# Patient Record
Sex: Male | Born: 2000
Health system: Southern US, Community
[De-identification: ages and names within clinical notes are randomized; demographics above are authoritative.]

## PROBLEM LIST (undated history)

## (undated) DIAGNOSIS — F909 Attention-deficit hyperactivity disorder, unspecified type: Secondary | ICD-10-CM

## (undated) HISTORY — PX: OTHER SURGICAL HISTORY: SHX169

## (undated) HISTORY — PX: TONSILLECTOMY: SUR1361

## (undated) HISTORY — PX: TYMPANOSTOMY TUBE PLACEMENT: SHX32

---
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
## 2019-04-07 DIAGNOSIS — J309 Allergic rhinitis, unspecified: Secondary | ICD-10-CM | POA: Diagnosis not present

## 2019-04-07 DIAGNOSIS — R3915 Urgency of urination: Secondary | ICD-10-CM | POA: Diagnosis not present

## 2019-04-07 DIAGNOSIS — K5900 Constipation, unspecified: Secondary | ICD-10-CM | POA: Diagnosis not present

## 2019-04-07 DIAGNOSIS — J039 Acute tonsillitis, unspecified: Secondary | ICD-10-CM | POA: Diagnosis not present

## 2019-04-07 DIAGNOSIS — F1220 Cannabis dependence, uncomplicated: Secondary | ICD-10-CM | POA: Diagnosis not present

## 2019-04-07 DIAGNOSIS — Z7251 High risk heterosexual behavior: Secondary | ICD-10-CM | POA: Diagnosis not present

## 2019-04-07 DIAGNOSIS — R0602 Shortness of breath: Secondary | ICD-10-CM | POA: Diagnosis not present

## 2019-04-08 DIAGNOSIS — A749 Chlamydial infection, unspecified: Secondary | ICD-10-CM | POA: Diagnosis not present

## 2019-04-15 DIAGNOSIS — F902 Attention-deficit hyperactivity disorder, combined type: Secondary | ICD-10-CM | POA: Diagnosis not present

## 2019-04-15 DIAGNOSIS — Z713 Dietary counseling and surveillance: Secondary | ICD-10-CM | POA: Diagnosis not present

## 2019-04-15 DIAGNOSIS — F4321 Adjustment disorder with depressed mood: Secondary | ICD-10-CM | POA: Diagnosis not present

## 2019-04-15 DIAGNOSIS — Z0000 Encounter for general adult medical examination without abnormal findings: Secondary | ICD-10-CM | POA: Diagnosis not present

## 2019-04-15 DIAGNOSIS — R799 Abnormal finding of blood chemistry, unspecified: Secondary | ICD-10-CM | POA: Diagnosis not present

## 2019-04-15 DIAGNOSIS — F1220 Cannabis dependence, uncomplicated: Secondary | ICD-10-CM | POA: Diagnosis not present

## 2019-04-15 DIAGNOSIS — Z0001 Encounter for general adult medical examination with abnormal findings: Secondary | ICD-10-CM | POA: Diagnosis not present

## 2019-04-15 DIAGNOSIS — Z1331 Encounter for screening for depression: Secondary | ICD-10-CM | POA: Diagnosis not present

## 2019-04-15 DIAGNOSIS — R899 Unspecified abnormal finding in specimens from other organs, systems and tissues: Secondary | ICD-10-CM | POA: Diagnosis not present

## 2019-04-15 DIAGNOSIS — F17200 Nicotine dependence, unspecified, uncomplicated: Secondary | ICD-10-CM | POA: Diagnosis not present

## 2019-04-21 ENCOUNTER — Telehealth: Payer: Self-pay | Admitting: Physician Assistant

## 2019-04-21 ENCOUNTER — Encounter: Payer: Self-pay | Admitting: Physician Assistant

## 2019-04-21 ENCOUNTER — Other Ambulatory Visit: Payer: Self-pay

## 2019-04-21 ENCOUNTER — Ambulatory Visit (INDEPENDENT_AMBULATORY_CARE_PROVIDER_SITE_OTHER): Payer: No Typology Code available for payment source | Admitting: Physician Assistant

## 2019-04-21 DIAGNOSIS — L905 Scar conditions and fibrosis of skin: Secondary | ICD-10-CM

## 2019-04-21 DIAGNOSIS — L700 Acne vulgaris: Secondary | ICD-10-CM

## 2019-04-21 DIAGNOSIS — L729 Follicular cyst of the skin and subcutaneous tissue, unspecified: Secondary | ICD-10-CM

## 2019-04-21 MED ORDER — ADAPALENE-BENZOYL PEROXIDE 0.1-2.5 % EX GEL: 1.0000 "application " | Freq: Every day | CUTANEOUS | 0 refills | Status: AC

## 2019-04-21 MED FILL — ADAPALENE-BENZOYL PEROXIDE: 0.1-2.5 | 30 days supply | Qty: 45 | Fill #0

## 2019-04-21 NOTE — Telephone Encounter (Signed)
Left voicemail that they can call and have the prescriptions transferred to the [facility].

## 2019-04-21 NOTE — Progress Notes (Addendum)
   New Patient Visit  Subjective  Jake Thomas is a 19 y.o. male who presents for the following: Cyst (L shoulder, under mid chin and L cheek). Also has regular acne on face, back and has been a problem for about 2 years. The cyst on the shoulder has been there for more than 2 years. Has been a problem with being visible and having an odor. The left cheek feels like there is scar tissue under the skin and has breakouts that come and go there. He also has had a cyst there that got big and red and opened and drained on its own in the past.    Objective  Well appearing patient in no apparent distress; mood and affect are within normal limits.  Face, chest and back were examined. No suspicious moles noted on back.  Pertinent skin findings in the Assessment and Plan.   Assessment & Plan  Acne vulgaris (2) Right Zygomatic Area; Left Buccal Cheek   Ordered Medications: Adapalene-Benzoyl Peroxide (EPIDUO) 0.1-2.5 % gel  Cyst of skin Left Shoulder - Anterior  Wil schedule for excision with Dr. Jorja Loa.  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 The documentation on 06/04/19 for the exam, diagnosis, procedures, and orders are all accurate and complete.

## 2019-04-21 NOTE — Telephone Encounter (Signed)
Patient now uses new pharmacy [facility] instead of [facility].   **Patient was Seen today by JCB.

## 2019-04-28 ENCOUNTER — Ambulatory Visit: Payer: No Typology Code available for payment source | Admitting: Physician Assistant

## 2019-04-29 ENCOUNTER — Ambulatory Visit (INDEPENDENT_AMBULATORY_CARE_PROVIDER_SITE_OTHER): Payer: 59 | Admitting: Dermatology

## 2019-04-29 ENCOUNTER — Other Ambulatory Visit: Payer: Self-pay

## 2019-04-29 ENCOUNTER — Encounter: Payer: Self-pay | Admitting: Dermatology

## 2019-04-29 DIAGNOSIS — L729 Follicular cyst of the skin and subcutaneous tissue, unspecified: Secondary | ICD-10-CM

## 2019-04-29 DIAGNOSIS — L720 Epidermal cyst: Secondary | ICD-10-CM | POA: Diagnosis not present

## 2019-04-29 NOTE — Patient Instructions (Signed)

## 2019-05-01 NOTE — Progress Notes (Addendum)
   New Patient   Subjective  Jake Thomas is a 19 y.o. male who presents for the following: Procedure (Patient here today for cyst removal on left shoulder x 2 years.  Patient denies pain, per patient at times it does have odor).   Cyst Location: Left inner shoulder Duration: Several years Quality: Stable Associated Signs/Symptoms: Modifying Factors:  Severity:  Timing: Context: Patient requests removal   The following portions of the chart were reviewed this encounter and updated as appropriate:     Objective  Well appearing patient in no apparent distress; mood and affect are within normal limits.  A focused examination was performed including head, neck, left chest/back. Relevant physical exam findings are noted in the Assessment and Plan.   Assessment & Plan  Cyst of skin Left Collarbone  Hibiclens, infiltrative lidocaine-epi, 1cm ellipse, fibrotic 1.5cm lesion dissected out in toto, layered vicryl/ethilon close, pressure dressing. Return for outer sutures two weeks.  Skin excision - Left Collarbone  Lesion length (cm):  1.9 Lesion width (cm):  1.9 Margin per side (cm):  0 Total excision diameter (cm):  1.9 Informed consent: discussed and consent obtained   Timeout: patient name, date of birth, surgical site, and procedure verified   Procedure prep:  Patient was prepped and draped in usual sterile fashion Prep type:  Povidone-iodine Anesthesia: the lesion was anesthetized in a standard fashion   Anesthetic:  1% lidocaine w/ epinephrine 1-100,000 buffered w/ 8.4% NaHCO3 Instrument used: #15 blade   Hemostasis achieved with: pressure   Outcome: patient tolerated procedure well with no complications   Post-procedure details: sterile dressing applied and wound care instructions given   Dressing type: petrolatum and pressure dressing    Skin repair - Left Collarbone Complexity:  Intermediate Undermining: edges could be approximated without difficulty and  edges undermined   Subcutaneous layers (deep stitches):  Suture size:  4-0 Suture type: Vicryl (polyglactin 910)   Stitches:  Buried vertical mattress Fine/surface layer approximation (top stitches):  Suture size:  3-0 Stitches: simple interrupted   Suture removal (days):  7 Hemostasis achieved with: suture Outcome: patient tolerated procedure well with no complications   Post-procedure details: sterile dressing applied and wound care instructions given   Dressing type: petrolatum and pressure dressing (mupirocin)    Specimen 1 - Surgical pathology Differential Diagnosis: R/O Cyst Check Margins: No Inflamed cyst left inner shoulder, patient requests removal.

## 2019-05-10 ENCOUNTER — Ambulatory Visit (INDEPENDENT_AMBULATORY_CARE_PROVIDER_SITE_OTHER): Payer: 59

## 2019-05-10 ENCOUNTER — Other Ambulatory Visit: Payer: Self-pay

## 2019-05-10 DIAGNOSIS — L729 Follicular cyst of the skin and subcutaneous tissue, unspecified: Secondary | ICD-10-CM

## 2019-05-10 DIAGNOSIS — Z4802 Encounter for removal of sutures: Secondary | ICD-10-CM

## 2019-05-10 NOTE — Progress Notes (Signed)
NTS suture removal. No s/s of infection and path to pt.  No sutures removed because patient stated they fell out about 2 days ago.

## 2019-05-11 DIAGNOSIS — Z0001 Encounter for general adult medical examination with abnormal findings: Secondary | ICD-10-CM | POA: Diagnosis not present

## 2019-05-11 DIAGNOSIS — Z749 Problem related to care provider dependency, unspecified: Secondary | ICD-10-CM | POA: Diagnosis not present

## 2019-05-11 DIAGNOSIS — F4320 Adjustment disorder, unspecified: Secondary | ICD-10-CM | POA: Diagnosis not present

## 2019-05-11 DIAGNOSIS — R062 Wheezing: Secondary | ICD-10-CM | POA: Diagnosis not present

## 2019-05-11 DIAGNOSIS — J309 Allergic rhinitis, unspecified: Secondary | ICD-10-CM | POA: Diagnosis not present

## 2019-05-11 DIAGNOSIS — Z113 Encounter for screening for infections with a predominantly sexual mode of transmission: Secondary | ICD-10-CM | POA: Diagnosis not present

## 2019-05-11 DIAGNOSIS — Z118 Encounter for screening for other infectious and parasitic diseases: Secondary | ICD-10-CM | POA: Diagnosis not present

## 2019-05-11 DIAGNOSIS — A749 Chlamydial infection, unspecified: Secondary | ICD-10-CM | POA: Diagnosis not present

## 2019-05-20 ENCOUNTER — Encounter: Payer: No Typology Code available for payment source | Admitting: Dermatology

## 2019-05-21 DIAGNOSIS — J309 Allergic rhinitis, unspecified: Secondary | ICD-10-CM | POA: Diagnosis not present

## 2019-05-21 DIAGNOSIS — Z749 Problem related to care provider dependency, unspecified: Secondary | ICD-10-CM | POA: Diagnosis not present

## 2019-05-21 DIAGNOSIS — R062 Wheezing: Secondary | ICD-10-CM | POA: Diagnosis not present

## 2019-05-21 DIAGNOSIS — F4320 Adjustment disorder, unspecified: Secondary | ICD-10-CM | POA: Diagnosis not present

## 2019-05-21 DIAGNOSIS — A749 Chlamydial infection, unspecified: Secondary | ICD-10-CM | POA: Diagnosis not present

## 2019-05-21 DIAGNOSIS — Z0001 Encounter for general adult medical examination with abnormal findings: Secondary | ICD-10-CM | POA: Diagnosis not present

## 2019-05-21 MED FILL — DOXYCYCLINE HYCLATE 100 MG: 100 | 7 days supply | Qty: 14 | Fill #0

## 2019-06-04 ENCOUNTER — Ambulatory Visit (INDEPENDENT_AMBULATORY_CARE_PROVIDER_SITE_OTHER): Payer: 59 | Admitting: Physician Assistant

## 2019-06-04 ENCOUNTER — Encounter: Payer: Self-pay | Admitting: Physician Assistant

## 2019-06-04 ENCOUNTER — Encounter (INDEPENDENT_AMBULATORY_CARE_PROVIDER_SITE_OTHER): Payer: Self-pay

## 2019-06-04 ENCOUNTER — Other Ambulatory Visit: Payer: Self-pay

## 2019-06-04 DIAGNOSIS — J309 Allergic rhinitis, unspecified: Secondary | ICD-10-CM | POA: Diagnosis not present

## 2019-06-04 DIAGNOSIS — L700 Acne vulgaris: Secondary | ICD-10-CM

## 2019-06-04 DIAGNOSIS — A749 Chlamydial infection, unspecified: Secondary | ICD-10-CM | POA: Diagnosis not present

## 2019-06-04 DIAGNOSIS — R062 Wheezing: Secondary | ICD-10-CM | POA: Diagnosis not present

## 2019-06-04 DIAGNOSIS — Z113 Encounter for screening for infections with a predominantly sexual mode of transmission: Secondary | ICD-10-CM | POA: Diagnosis not present

## 2019-06-04 DIAGNOSIS — Z0001 Encounter for general adult medical examination with abnormal findings: Secondary | ICD-10-CM | POA: Diagnosis not present

## 2019-06-04 DIAGNOSIS — Z118 Encounter for screening for other infectious and parasitic diseases: Secondary | ICD-10-CM | POA: Diagnosis not present

## 2019-06-04 DIAGNOSIS — F4320 Adjustment disorder, unspecified: Secondary | ICD-10-CM | POA: Diagnosis not present

## 2019-06-04 DIAGNOSIS — Z749 Problem related to care provider dependency, unspecified: Secondary | ICD-10-CM | POA: Diagnosis not present

## 2019-06-04 MED ORDER — CLINDAMYCIN PHOSPHATE 1 % EX GEL: Freq: Every morning | CUTANEOUS | 2 refills | Status: AC

## 2019-06-04 MED FILL — CLINDAMYCIN PHOSPHATE 1 % G: 1 | 20 days supply | Qty: 60 | Fill #0

## 2019-06-04 NOTE — Progress Notes (Signed)
   Follow up Visit  Subjective  Jake Thomas is a 19 y.o. male who presents for the following: Acne (Wants to discuss treatments for his acne for face and neck). He recently came in for excision of a cyst near the ear. He feels he has more cysts near ear lobes and on neck. He has been using the Epiduo and it seems to helping but he is still getting many new bumps. It doesn't irritate his skin.   Objective  Well appearing patient in no apparent distress; mood and affect are within normal limits.  Face, chest, back examined. Relevant physical exam findings are noted in the Assessment and Plan. No suspicious moles noted on back.   Objective  Chest - Medial [facility]), Head - Anterior (Face): Follicularly based papules and pustules with comedones   Assessment & Plan  Acne vulgaris (2) Head - Anterior (Face); Chest - Medial (Center)  Ordered Medications: clindamycin (CLINDAGEL) 1 % gel  Other Related Medications Adapalene-Benzoyl Peroxide (EPIDUO) 0.1-2.5 % gel  Discussed option to eventually add an oral antibiotic or do Isotretinoin in the future.   I, Shelly Flatten, PA-C, have reviewed all documentation for this visit. The documentation on 06/04/19 for the exam, diagnosis, procedures, and orders are all accurate and complete.

## 2019-06-18 NOTE — Addendum Note (Signed)
Addended by: Viann Fish on: 06/18/2019 10:46 AM   Modules accepted: Level of Service

## 2019-08-05 ENCOUNTER — Ambulatory Visit: Payer: 59 | Admitting: Physician Assistant

## 2019-09-01 ENCOUNTER — Ambulatory Visit: Payer: 59 | Admitting: Physician Assistant

## 2019-09-01 ENCOUNTER — Other Ambulatory Visit: Payer: Self-pay

## 2019-09-01 ENCOUNTER — Encounter: Payer: Self-pay | Admitting: Physician Assistant

## 2019-09-01 DIAGNOSIS — L723 Sebaceous cyst: Secondary | ICD-10-CM | POA: Diagnosis not present

## 2019-09-01 DIAGNOSIS — L700 Acne vulgaris: Secondary | ICD-10-CM

## 2019-09-01 MED ORDER — TRIAMCINOLONE ACETONIDE 10 MG/ML IJ SUSP
10.0000 mg | Freq: Once | INTRAMUSCULAR | Status: AC
  Administered 2019-09-01: 10 mg

## 2019-09-01 NOTE — Progress Notes (Signed)
   Follow up Visit  Subjective  Jake Thomas is a 19 y.o. male who presents for the following: Acne (comes and goes, two spots on jawline bilateral are irritated). He feels like he has less new smal bumps but feels like he is getting a few recurring lesions on his forehead and glabella and two larger bumps along the jawline. He has been using the clindamycin but has not been consistent with the epiduo.  Objective  Well appearing patient in no apparent distress; mood and affect are within normal limits.  Face examined. Relevant physical exam findings are noted in the Assessment and Plan.   Objective  Left Submandibular Area, Right Submandibular Area: Erythematous papules and pustules with comedones   Objective  Left Anterior Neck: Small inflamed nodule left neck  Assessment & Plan  Acne vulgaris (2) Left Submandibular Area; Right Submandibular Area  Other Related Medications Adapalene-Benzoyl Peroxide (EPIDUO) 0.1-2.5 % gel clindamycin (CLINDAGEL) 1 % gel  Inflamed epidermoid cyst of skin Left Anterior Neck  Intralesional injection - Left Anterior Neck Location: left jawline  Informed Consent: Discussed risks (infection, pain, bleeding, bruising, thinning of the skin, loss of skin pigment,  Indentation, lack of resolution, and recurrence of lesion) and benefits of the procedure, as well as the alternatives. Informed consent was obtained. Preparation: The area was prepared in a standard fashion.   Procedure Details: An intralesional injection was performed with Kenalog 5 mg/cc. 0.1 cc in total were injected.  Total number of injections: 1  Plan: The patient was instructed on post-op care. Recommend OTC analgesia as needed for pain.

## 2019-09-16 IMAGING — CT CT ABD-PELV W/ CM
2 of 4 series · 14 of 46 positions shown, 16 images · IV contrast (APPLIED)
Comparison: None.

CLINICAL DATA: 17 y/o male with c/o mid abd pain to RLQ,
constipation x3 days. No prior occurrence. R/o appendicitis.

EXAM:
CT ABDOMEN AND PELVIS WITH CONTRAST
TECHNIQUE: Multidetector CT imaging of the abdomen and pelvis was performed
using the standard protocol following bolus administration of
intravenous contrast.
CONTRAST:  100mL QHSPLO-CUU IOPAMIDOL (QHSPLO-CUU) INJECTION 61%

[Series 2: axial st · axial · 0.69mm/px · z∈[+405,+825]mm · 11 of 100 slices shown, 13 images]
[im 8/100  soft-tissue]
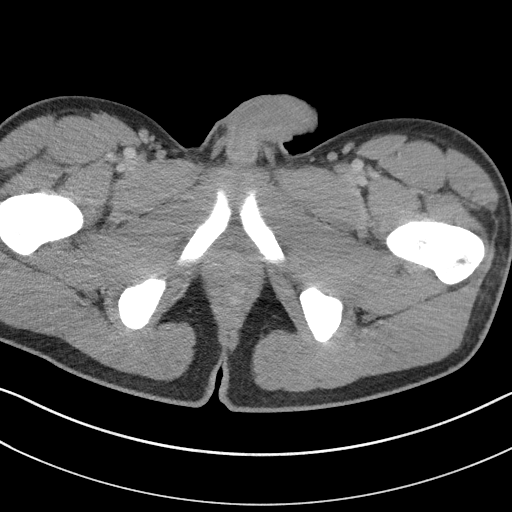
[im 8/100  bone]
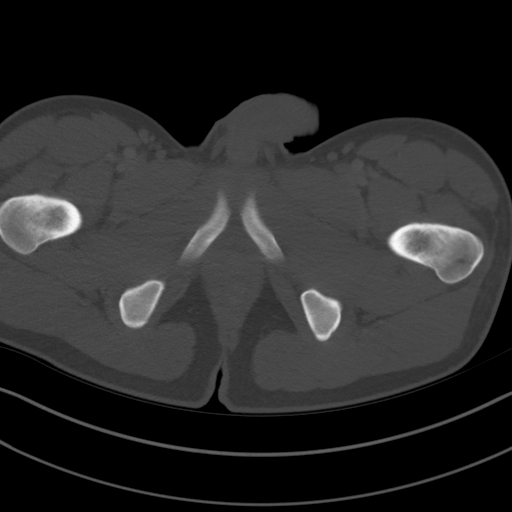
[im 16/100  soft-tissue]
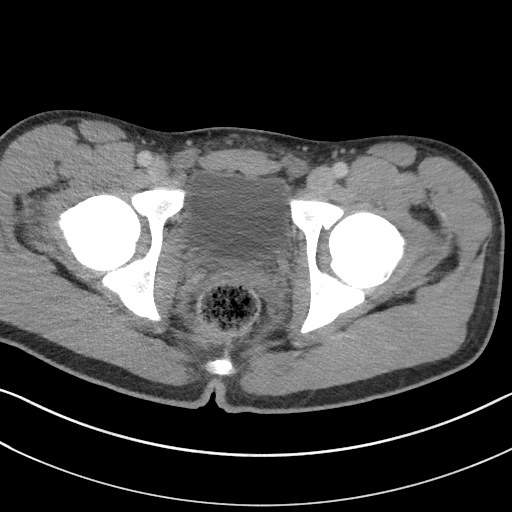
[im 24/100  soft-tissue]
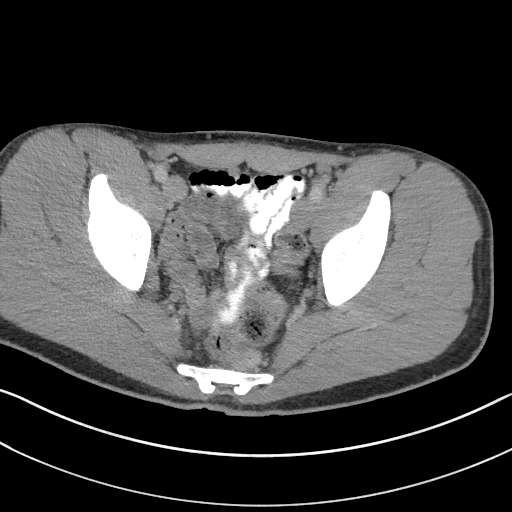
[im 32/100  soft-tissue]
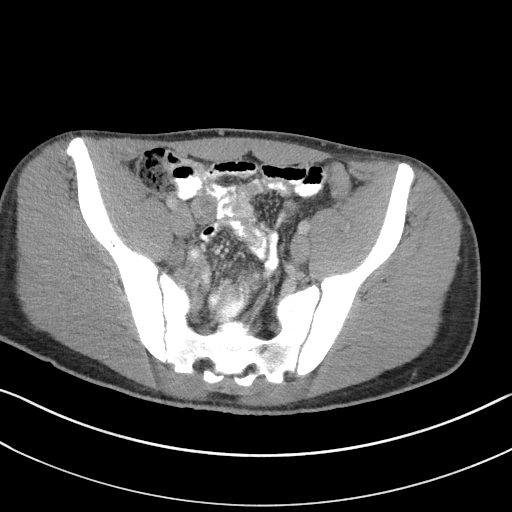
[im 40/100  soft-tissue]
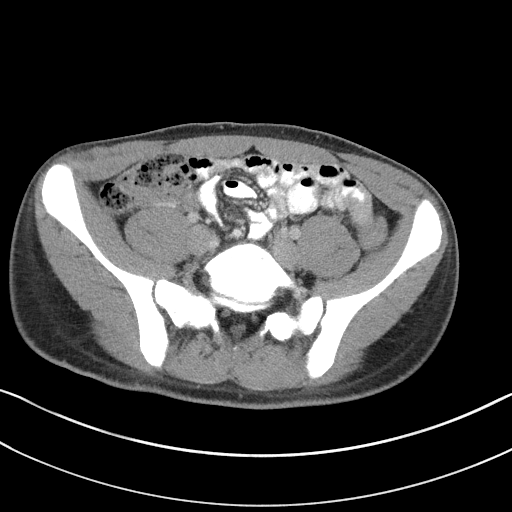
[im 52/100  soft-tissue]
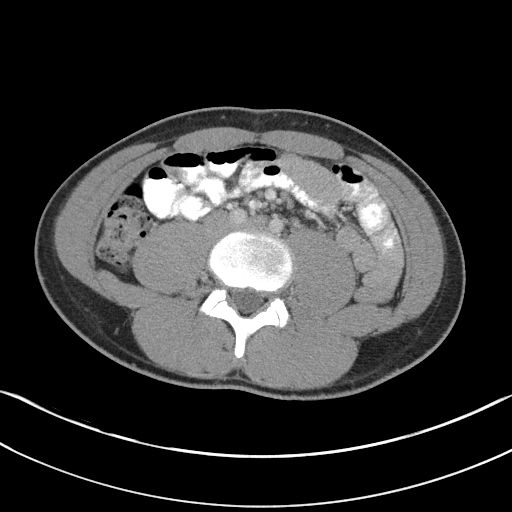
[im 60/100  soft-tissue]
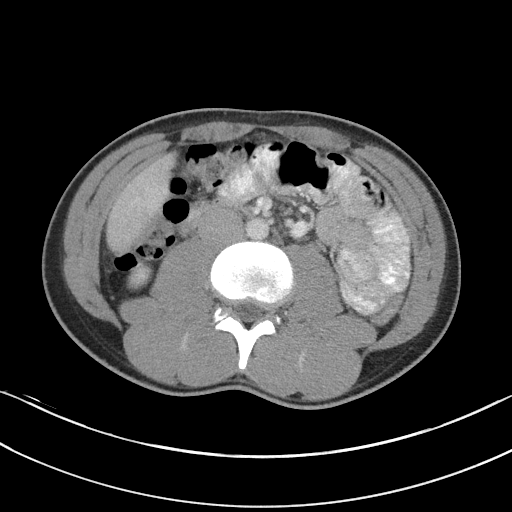
[im 68/100  soft-tissue]
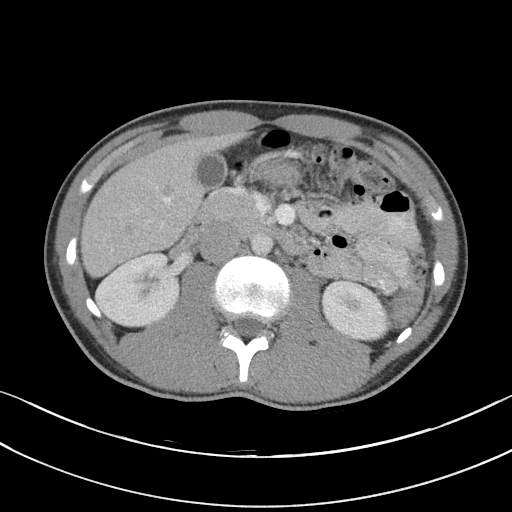
[im 76/100  soft-tissue]
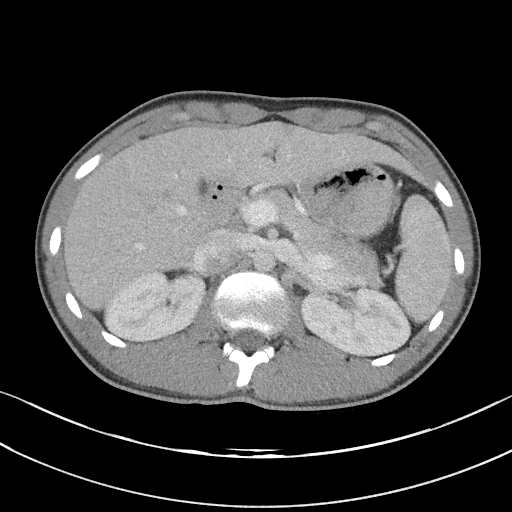
[im 76/100  bone]
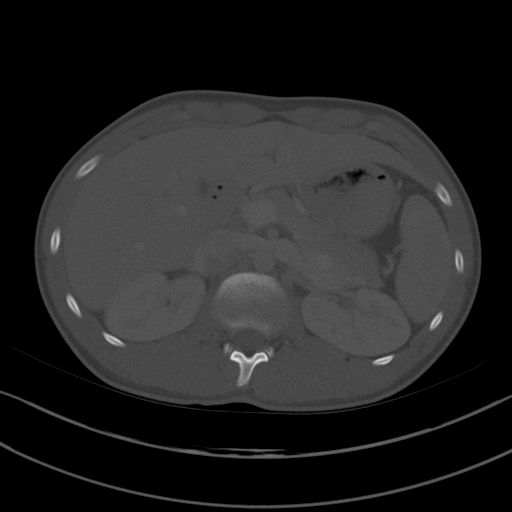
[im 84/100  soft-tissue]
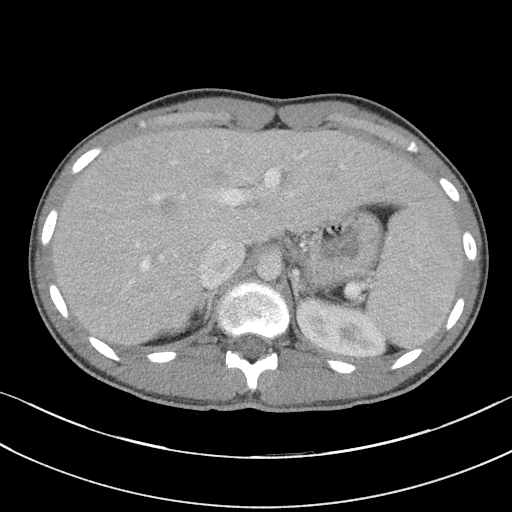
[im 92/100  soft-tissue]
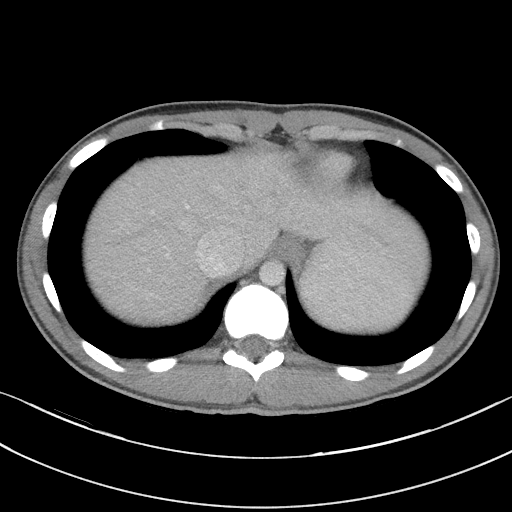

[Series 5: coronal st · coronal · 0.58mm/px · 3 of 92 slices shown]
[im 31/92  soft-tissue]
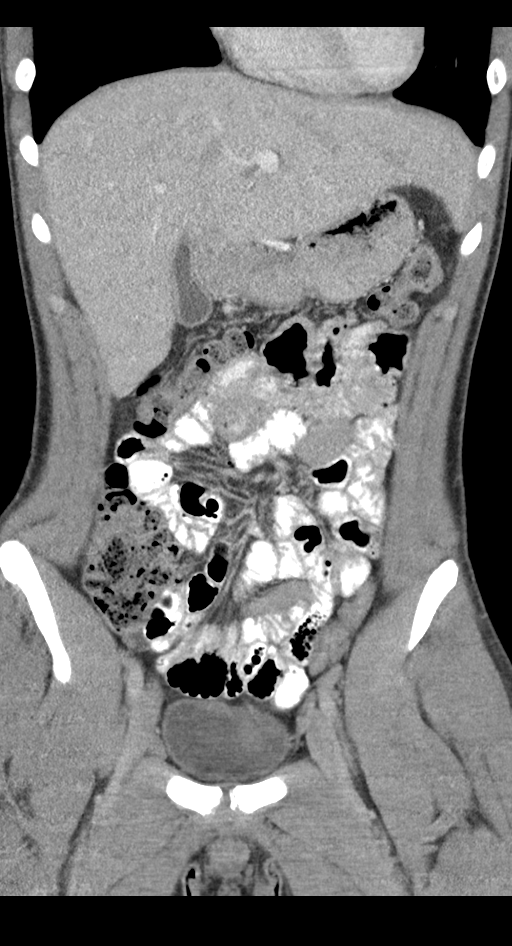
[im 41/92  soft-tissue]
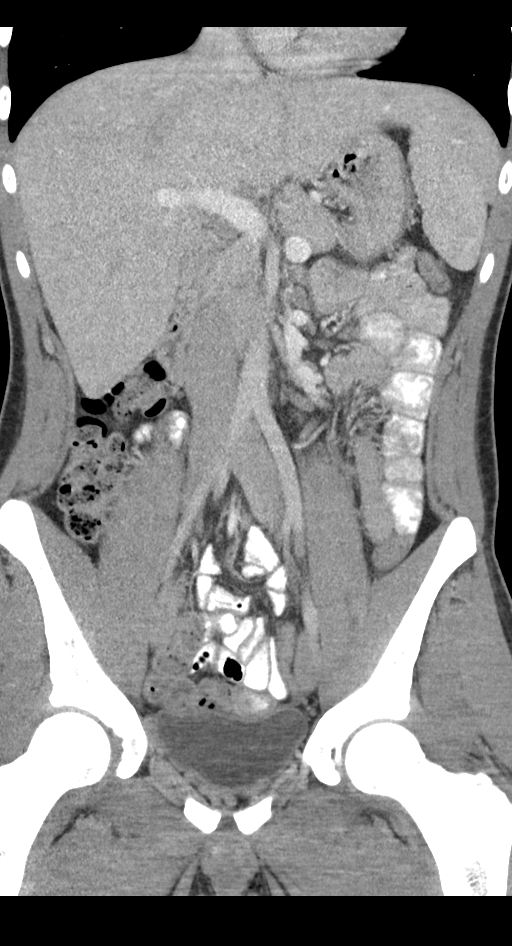
[im 51/92  soft-tissue]
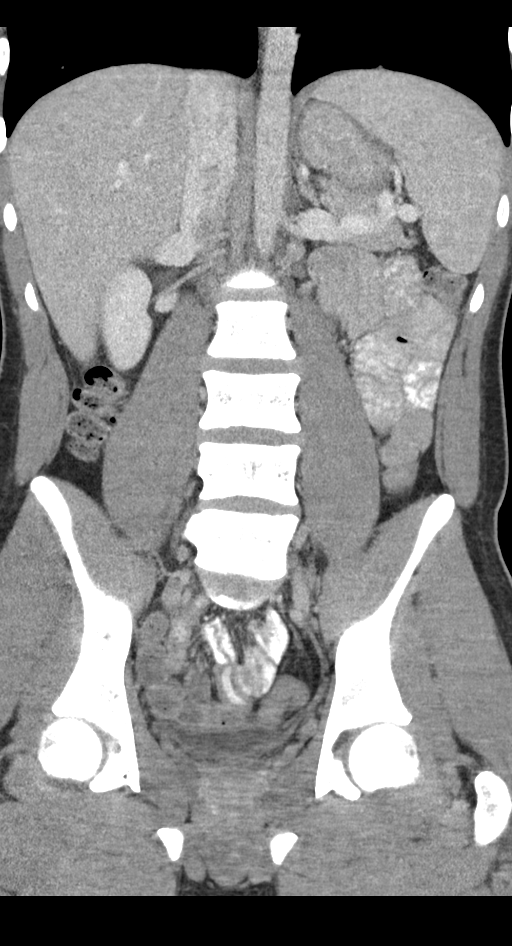

[14 of 46 positions shown; findings below may reference images not displayed]

FINDINGS: Lower chest: No acute abnormality.

Hepatobiliary: No focal liver abnormality is seen. No gallstones,
gallbladder wall thickening, or biliary dilatation.

Pancreas: Unremarkable. No pancreatic ductal dilatation or
surrounding inflammatory changes.

Spleen: Normal in size without focal abnormality.

Adrenals/Urinary Tract: Normal appearance of the adrenal glands.
Symmetric enhancement in both kidneys. No hydronephrosis or urinary
tract obstruction. The bladder and visualized portion of the urethra
are normal.

Stomach/Bowel: Stomach is normal in appearance. Small bowel loops
are normal in wall thickness and caliber. Fecal material is
identified with in the distal small bowel loops, consistent with
obstipation. Significant stool burden throughout otherwise normal
appearing loops of colon. The appendix is well seen and has a normal
appearance.

Vascular/Lymphatic: No significant vascular findings are present. No
enlarged abdominal or pelvic lymph nodes.

Reproductive: Prostate is unremarkable.

Other: No abdominal wall hernia or abnormality. No abdominopelvic
ascites.

Musculoskeletal: No acute or significant osseous findings.
IMPRESSION: 1. No evidence for acute abnormality.
2. Normal appendix.
3. Significant stool burden.

## 2019-12-09 ENCOUNTER — Other Ambulatory Visit (HOSPITAL_COMMUNITY): Payer: Self-pay | Admitting: Family Medicine

## 2019-12-09 DIAGNOSIS — F4320 Adjustment disorder, unspecified: Secondary | ICD-10-CM | POA: Diagnosis not present

## 2019-12-09 DIAGNOSIS — Z0001 Encounter for general adult medical examination with abnormal findings: Secondary | ICD-10-CM | POA: Diagnosis not present

## 2019-12-09 DIAGNOSIS — A749 Chlamydial infection, unspecified: Secondary | ICD-10-CM | POA: Diagnosis not present

## 2019-12-09 DIAGNOSIS — R062 Wheezing: Secondary | ICD-10-CM | POA: Diagnosis not present

## 2019-12-09 MED FILL — FLUTICASONE PROP 50 MCG SPR: 50 | 30 days supply | Qty: 16 | Fill #0

## 2019-12-09 MED FILL — predniSONE 10 MG TABS: 10 | 7 days supply | Qty: 7 | Fill #0

## 2019-12-09 MED FILL — AZITHROMYCIN 250 MG TABLET: 250 | 5 days supply | Qty: 6 | Fill #0

## 2020-04-03 DIAGNOSIS — H5213 Myopia, bilateral: Secondary | ICD-10-CM | POA: Diagnosis not present

## 2020-05-03 ENCOUNTER — Other Ambulatory Visit: Payer: Self-pay | Admitting: Family Medicine

## 2020-05-03 DIAGNOSIS — Z1152 Encounter for screening for COVID-19: Secondary | ICD-10-CM | POA: Diagnosis not present

## 2020-05-03 DIAGNOSIS — Z20822 Contact with and (suspected) exposure to covid-19: Secondary | ICD-10-CM | POA: Diagnosis not present

## 2020-05-04 LAB — SARS CORONAVIRUS 2 (TAT 6-24 HRS): SARS Coronavirus 2: NEGATIVE

## 2020-09-18 ENCOUNTER — Other Ambulatory Visit (HOSPITAL_COMMUNITY): Payer: Self-pay

## 2020-09-18 DIAGNOSIS — J189 Pneumonia, unspecified organism: Secondary | ICD-10-CM | POA: Diagnosis not present

## 2020-09-18 DIAGNOSIS — Z20822 Contact with and (suspected) exposure to covid-19: Secondary | ICD-10-CM | POA: Diagnosis not present

## 2020-09-18 DIAGNOSIS — R062 Wheezing: Secondary | ICD-10-CM | POA: Diagnosis not present

## 2020-09-18 MED ORDER — AZITHROMYCIN 500 MG PO TABS
ORAL_TABLET | ORAL | 0 refills | Status: AC
  Filled 2020-09-18: qty 5, 5d supply, fill #0

## 2020-09-18 MED ORDER — BUDESONIDE-FORMOTEROL FUMARATE 160-4.5 MCG/ACT IN AERO
INHALATION_SPRAY | RESPIRATORY_TRACT | 0 refills | Status: AC
  Filled 2020-09-18: qty 10.2, 30d supply, fill #0

## 2020-09-18 MED ORDER — PREDNISONE 10 MG PO TABS
ORAL_TABLET | ORAL | 0 refills | Status: AC
  Filled 2020-09-18: qty 15, 10d supply, fill #0

## 2020-12-15 ENCOUNTER — Other Ambulatory Visit (HOSPITAL_COMMUNITY): Payer: Self-pay

## 2020-12-15 DIAGNOSIS — Z1159 Encounter for screening for other viral diseases: Secondary | ICD-10-CM | POA: Diagnosis not present

## 2020-12-15 DIAGNOSIS — B349 Viral infection, unspecified: Secondary | ICD-10-CM | POA: Diagnosis not present

## 2020-12-15 DIAGNOSIS — J019 Acute sinusitis, unspecified: Secondary | ICD-10-CM | POA: Diagnosis not present

## 2020-12-15 DIAGNOSIS — R062 Wheezing: Secondary | ICD-10-CM | POA: Diagnosis not present

## 2020-12-15 MED ORDER — AZITHROMYCIN 250 MG PO TABS
ORAL_TABLET | ORAL | 0 refills | Status: AC
  Filled 2020-12-15: qty 6, 5d supply, fill #0

## 2020-12-15 MED ORDER — PREDNISONE 20 MG PO TABS
ORAL_TABLET | ORAL | 0 refills | Status: AC
  Filled 2020-12-15: qty 7, 9d supply, fill #0

## 2021-11-06 ENCOUNTER — Other Ambulatory Visit (HOSPITAL_COMMUNITY): Payer: Self-pay

## 2021-11-06 DIAGNOSIS — F4320 Adjustment disorder, unspecified: Secondary | ICD-10-CM | POA: Diagnosis not present

## 2021-11-06 DIAGNOSIS — F909 Attention-deficit hyperactivity disorder, unspecified type: Secondary | ICD-10-CM | POA: Diagnosis not present

## 2021-11-06 MED ORDER — ATOMOXETINE HCL 18 MG PO CAPS
18.0000 mg | ORAL_CAPSULE | Freq: Two times a day (BID) | ORAL | 0 refills | Status: DC
  Filled 2021-11-06: qty 60, 30d supply, fill #0

## 2021-11-07 ENCOUNTER — Other Ambulatory Visit (HOSPITAL_COMMUNITY): Payer: Self-pay

## 2021-11-12 ENCOUNTER — Other Ambulatory Visit (HOSPITAL_COMMUNITY): Payer: Self-pay

## 2021-11-26 DIAGNOSIS — H5213 Myopia, bilateral: Secondary | ICD-10-CM | POA: Diagnosis not present

## 2022-01-25 DIAGNOSIS — J111 Influenza due to unidentified influenza virus with other respiratory manifestations: Secondary | ICD-10-CM | POA: Diagnosis not present

## 2022-03-04 DIAGNOSIS — L237 Allergic contact dermatitis due to plants, except food: Secondary | ICD-10-CM | POA: Diagnosis not present

## 2022-06-24 ENCOUNTER — Other Ambulatory Visit (HOSPITAL_COMMUNITY): Payer: Self-pay

## 2022-06-24 MED ORDER — AZITHROMYCIN 250 MG PO TABS
ORAL_TABLET | ORAL | 0 refills | Status: AC
  Filled 2022-06-24: qty 6, 5d supply, fill #0

## 2022-06-24 MED ORDER — PROMETHAZINE-DM 6.25-15 MG/5ML PO SYRP
ORAL_SOLUTION | ORAL | 0 refills | Status: AC
  Filled 2022-06-24: qty 150, 5d supply, fill #0

## 2022-06-24 MED ORDER — BENZONATATE 100 MG PO CAPS
100.0000 mg | ORAL_CAPSULE | Freq: Three times a day (TID) | ORAL | 0 refills | Status: AC
  Filled 2022-06-24: qty 15, 5d supply, fill #0

## 2022-06-24 MED ORDER — PREDNISONE 10 MG (21) PO TBPK
ORAL_TABLET | ORAL | 0 refills | Status: AC
  Filled 2022-06-24: qty 21, 6d supply, fill #0

## 2022-10-10 ENCOUNTER — Other Ambulatory Visit (HOSPITAL_COMMUNITY): Payer: Self-pay

## 2022-10-10 MED ORDER — MELOXICAM 7.5 MG PO TABS
7.5000 mg | ORAL_TABLET | Freq: Two times a day (BID) | ORAL | 0 refills | Status: AC
  Filled 2022-10-10: qty 20, 10d supply, fill #0

## 2022-10-22 ENCOUNTER — Other Ambulatory Visit (HOSPITAL_COMMUNITY): Payer: Self-pay

## 2022-11-05 ENCOUNTER — Other Ambulatory Visit (HOSPITAL_BASED_OUTPATIENT_CLINIC_OR_DEPARTMENT_OTHER): Payer: Self-pay

## 2022-11-20 ENCOUNTER — Other Ambulatory Visit (HOSPITAL_COMMUNITY): Payer: Self-pay

## 2022-11-20 MED ORDER — SULFAMETHOXAZOLE-TRIMETHOPRIM 800-160 MG PO TABS
ORAL_TABLET | ORAL | 0 refills | Status: AC
  Filled 2022-11-20: qty 10, 5d supply, fill #0
